# Patient Record
Sex: Male | Born: 1945 | Hispanic: Yes | Marital: Married
Health system: Southern US, Community
[De-identification: ages and names within clinical notes are randomized; demographics above are authoritative.]

---

## 2006-06-28 ENCOUNTER — Ambulatory Visit: Payer: Self-pay | Admitting: *Deleted

## 2006-07-18 ENCOUNTER — Ambulatory Visit: Payer: Self-pay

## 2006-08-31 ENCOUNTER — Ambulatory Visit: Payer: Self-pay

## 2007-12-22 ENCOUNTER — Ambulatory Visit: Payer: Self-pay | Admitting: Family Medicine

## 2008-12-29 ENCOUNTER — Ambulatory Visit: Payer: Self-pay | Admitting: Internal Medicine

## 2011-12-26 ENCOUNTER — Ambulatory Visit: Payer: Self-pay

## 2013-05-29 ENCOUNTER — Inpatient Hospital Stay: Payer: Self-pay | Admitting: Family Medicine

## 2013-05-29 LAB — CBC
HGB: 13 g/dL (ref 13.0–18.0)
MCH: 28.9 pg (ref 26.0–34.0)
MCHC: 34 g/dL (ref 32.0–36.0)
MCV: 85 fL (ref 80–100)
RBC: 4.49 10*6/uL (ref 4.40–5.90)
RDW: 14.5 % (ref 11.5–14.5)
WBC: 15.5 10*3/uL — ABNORMAL HIGH (ref 3.8–10.6)

## 2013-05-29 LAB — COMPREHENSIVE METABOLIC PANEL
Alkaline Phosphatase: 126 U/L (ref 50–136)
Anion Gap: 7 (ref 7–16)
BUN: 35 mg/dL — ABNORMAL HIGH (ref 7–18)
Chloride: 97 mmol/L — ABNORMAL LOW (ref 98–107)
Co2: 27 mmol/L (ref 21–32)
EGFR (Non-African Amer.): 39 — ABNORMAL LOW
Osmolality: 283 (ref 275–301)
Potassium: 4 mmol/L (ref 3.5–5.1)
Sodium: 131 mmol/L — ABNORMAL LOW (ref 136–145)
Total Protein: 7.9 g/dL (ref 6.4–8.2)

## 2013-05-29 LAB — MAGNESIUM: Magnesium: 1.5 mg/dL — ABNORMAL LOW

## 2013-05-29 LAB — CK TOTAL AND CKMB (NOT AT ARMC): CK-MB: 1.1 ng/mL (ref 0.5–3.6)

## 2013-05-29 LAB — TSH: Thyroid Stimulating Horm: 2.3 u[IU]/mL

## 2013-05-29 LAB — TROPONIN I: Troponin-I: <0.02 ng/mL

## 2013-05-30 DIAGNOSIS — I319 Disease of pericardium, unspecified: Secondary | ICD-10-CM

## 2013-05-30 LAB — BASIC METABOLIC PANEL
Anion Gap: 10 (ref 7–16)
BUN: 55 mg/dL — ABNORMAL HIGH (ref 7–18)
Calcium, Total: 8.5 mg/dL (ref 8.5–10.1)
Chloride: 98 mmol/L (ref 98–107)
Co2: 23 mmol/L (ref 21–32)
Creatinine: 3.03 mg/dL — ABNORMAL HIGH (ref 0.60–1.30)
EGFR (Non-African Amer.): 20 — ABNORMAL LOW
Glucose: 273 mg/dL — ABNORMAL HIGH (ref 65–99)
Osmolality: 287 (ref 275–301)
Potassium: 4.5 mmol/L (ref 3.5–5.1)
Sodium: 131 mmol/L — ABNORMAL LOW (ref 136–145)

## 2013-05-30 LAB — CBC WITH DIFFERENTIAL/PLATELET
Basophil #: 0.1 10*3/uL (ref 0.0–0.1)
Basophil #: 0.1 10*3/uL (ref 0.0–0.1)
Basophil %: 0.8 %
Eosinophil #: 0.1 10*3/uL (ref 0.0–0.7)
Eosinophil #: 0.1 10*3/uL (ref 0.0–0.7)
Eosinophil %: 0.4 %
Eosinophil %: 0.5 %
HCT: 36 % — ABNORMAL LOW (ref 40.0–52.0)
HGB: 12 g/dL — ABNORMAL LOW (ref 13.0–18.0)
Lymphocyte #: 2.3 10*3/uL (ref 1.0–3.6)
MCH: 29.2 pg (ref 26.0–34.0)
MCHC: 33.3 g/dL (ref 32.0–36.0)
MCHC: 34.6 g/dL (ref 32.0–36.0)
MCV: 84 fL (ref 80–100)
Monocyte #: 2.1 x10 3/mm — ABNORMAL HIGH (ref 0.2–1.0)
Monocyte #: 1.6 x10 3/mm — ABNORMAL HIGH (ref 0.2–1.0)
Monocyte %: 13.9 %
Monocyte %: 11.4 %
Neutrophil #: 10.6 10*3/uL — ABNORMAL HIGH (ref 1.4–6.5)
Neutrophil %: 70.1 %
Neutrophil %: 71.8 %
Platelet: 182 10*3/uL (ref 150–440)
Platelet: 196 10*3/uL (ref 150–440)
RBC: 4.28 10*6/uL — ABNORMAL LOW (ref 4.40–5.90)
RDW: 15 % — ABNORMAL HIGH (ref 11.5–14.5)
RDW: 14.9 % — ABNORMAL HIGH (ref 11.5–14.5)

## 2013-05-30 LAB — LIPID PANEL
Cholesterol: 176 mg/dL (ref 0–200)
HDL Cholesterol: 42 mg/dL (ref 40–60)
Ldl Cholesterol, Calc: 106 mg/dL — ABNORMAL HIGH (ref 0–100)
Triglycerides: 140 mg/dL (ref 0–200)
VLDL Cholesterol, Calc: 28 mg/dL (ref 5–40)

## 2013-05-30 LAB — CK TOTAL AND CKMB (NOT AT ARMC)
CK, Total: 31 U/L — ABNORMAL LOW (ref 35–232)
CK-MB: 1.1 ng/mL (ref 0.5–3.6)

## 2013-05-30 LAB — HEMOGLOBIN A1C: Hemoglobin A1C: 9.4 % — ABNORMAL HIGH (ref 4.2–6.3)

## 2013-05-30 LAB — TROPONIN I: Troponin-I: <0.02 ng/mL

## 2013-05-31 DIAGNOSIS — I428 Other cardiomyopathies: Secondary | ICD-10-CM

## 2013-05-31 DIAGNOSIS — N186 End stage renal disease: Secondary | ICD-10-CM

## 2013-05-31 DIAGNOSIS — D72829 Elevated white blood cell count, unspecified: Secondary | ICD-10-CM

## 2013-06-01 LAB — CBC WITH DIFFERENTIAL/PLATELET
Basophil #: 0.1 10*3/uL (ref 0.0–0.1)
Eosinophil %: 1.5 %
HCT: 35.2 % — ABNORMAL LOW (ref 40.0–52.0)
Lymphocyte #: 2.5 10*3/uL (ref 1.0–3.6)
Lymphocyte %: 24.6 %
MCHC: 34.1 g/dL (ref 32.0–36.0)
Neutrophil #: 6.1 10*3/uL (ref 1.4–6.5)
RDW: 14.7 % — ABNORMAL HIGH (ref 11.5–14.5)
WBC: 10.2 10*3/uL (ref 3.8–10.6)

## 2013-06-01 LAB — RENAL FUNCTION PANEL
Anion Gap: 12 (ref 7–16)
Calcium, Total: 8.3 mg/dL — ABNORMAL LOW (ref 8.5–10.1)
EGFR (African American): 14 — ABNORMAL LOW
Glucose: 265 mg/dL — ABNORMAL HIGH (ref 65–99)
Potassium: 3.8 mmol/L (ref 3.5–5.1)
Sodium: 130 mmol/L — ABNORMAL LOW (ref 136–145)

## 2013-06-03 LAB — CULTURE, BLOOD (SINGLE)

## 2013-08-29 ENCOUNTER — Inpatient Hospital Stay: Payer: Self-pay | Admitting: Internal Medicine

## 2013-08-29 LAB — URINALYSIS, COMPLETE
BILIRUBIN, UR: NEGATIVE
Glucose,UR: NEGATIVE mg/dL (ref 0–75)
KETONE: NEGATIVE
NITRITE: NEGATIVE
Ph: 6 (ref 4.5–8.0)
Protein: >=500
Specific Gravity: 1.012 (ref 1.003–1.030)
Squamous Epithelial: NONE SEEN

## 2013-08-29 LAB — CBC WITH DIFFERENTIAL/PLATELET
Basophil #: 0 10*3/uL (ref 0.0–0.1)
Basophil %: 0.6 %
EOS PCT: 1.7 %
Eosinophil #: 0.1 10*3/uL (ref 0.0–0.7)
HCT: 37.6 % — ABNORMAL LOW (ref 40.0–52.0)
HGB: 12.3 g/dL — ABNORMAL LOW (ref 13.0–18.0)
LYMPHS ABS: 1.2 10*3/uL (ref 1.0–3.6)
LYMPHS PCT: 17.3 %
MCH: 28.3 pg (ref 26.0–34.0)
MCHC: 32.6 g/dL (ref 32.0–36.0)
MCV: 87 fL (ref 80–100)
MONOS PCT: 21.4 %
Monocyte #: 1.5 x10 3/mm — ABNORMAL HIGH (ref 0.2–1.0)
Neutrophil #: 4.1 10*3/uL (ref 1.4–6.5)
Neutrophil %: 59 %
PLATELETS: 152 10*3/uL (ref 150–440)
RBC: 4.33 10*6/uL — AB (ref 4.40–5.90)
RDW: 15.9 % — ABNORMAL HIGH (ref 11.5–14.5)
WBC: 6.9 10*3/uL (ref 3.8–10.6)

## 2013-08-29 LAB — PROTIME-INR
INR: 1.1
Prothrombin Time: 14.3 secs (ref 11.5–14.7)

## 2013-08-29 LAB — COMPREHENSIVE METABOLIC PANEL
AST: 24 U/L (ref 15–37)
Albumin: 3.1 g/dL — ABNORMAL LOW (ref 3.4–5.0)
Alkaline Phosphatase: 128 U/L — ABNORMAL HIGH
Anion Gap: 6 — ABNORMAL LOW (ref 7–16)
BUN: 42 mg/dL — ABNORMAL HIGH (ref 7–18)
Bilirubin,Total: 0.5 mg/dL (ref 0.2–1.0)
CHLORIDE: 98 mmol/L (ref 98–107)
CREATININE: 2.44 mg/dL — AB (ref 0.60–1.30)
Calcium, Total: 8.3 mg/dL — ABNORMAL LOW (ref 8.5–10.1)
Co2: 27 mmol/L (ref 21–32)
EGFR (African American): 31 — ABNORMAL LOW
EGFR (Non-African Amer.): 26 — ABNORMAL LOW
Glucose: 326 mg/dL — ABNORMAL HIGH (ref 65–99)
Osmolality: 286 (ref 275–301)
POTASSIUM: 4.1 mmol/L (ref 3.5–5.1)
SGPT (ALT): 28 U/L (ref 12–78)
Sodium: 131 mmol/L — ABNORMAL LOW (ref 136–145)
Total Protein: 7.7 g/dL (ref 6.4–8.2)

## 2013-08-29 LAB — PHOSPHORUS: PHOSPHORUS: 2.4 mg/dL — AB (ref 2.5–4.9)

## 2013-08-29 LAB — MAGNESIUM: MAGNESIUM: 1.8 mg/dL

## 2013-08-29 LAB — TROPONIN I: Troponin-I: <0.02 ng/mL

## 2013-08-29 LAB — RAPID INFLUENZA A&B ANTIGENS

## 2013-08-30 ENCOUNTER — Ambulatory Visit: Payer: Self-pay | Admitting: Orthopedic Surgery

## 2013-08-30 LAB — CBC WITH DIFFERENTIAL/PLATELET
Basophil #: 0.1 10*3/uL (ref 0.0–0.1)
Basophil %: 0.8 %
EOS ABS: 0.2 10*3/uL (ref 0.0–0.7)
Eosinophil %: 2.8 %
HCT: 34.3 % — ABNORMAL LOW (ref 40.0–52.0)
HGB: 11.6 g/dL — AB (ref 13.0–18.0)
Lymphocyte #: 1.9 10*3/uL (ref 1.0–3.6)
Lymphocyte %: 28.6 %
MCH: 29.1 pg (ref 26.0–34.0)
MCHC: 33.9 g/dL (ref 32.0–36.0)
MCV: 86 fL (ref 80–100)
Monocyte #: 1.6 x10 3/mm — ABNORMAL HIGH (ref 0.2–1.0)
Monocyte %: 23.5 %
Neutrophil #: 3 10*3/uL (ref 1.4–6.5)
Neutrophil %: 44.3 %
PLATELETS: 138 10*3/uL — AB (ref 150–440)
RBC: 4 10*6/uL — ABNORMAL LOW (ref 4.40–5.90)
RDW: 16.2 % — ABNORMAL HIGH (ref 11.5–14.5)
WBC: 6.8 10*3/uL (ref 3.8–10.6)

## 2013-08-30 LAB — COMPREHENSIVE METABOLIC PANEL
ALBUMIN: 2.6 g/dL — AB (ref 3.4–5.0)
ALT: 23 U/L (ref 12–78)
Alkaline Phosphatase: 110 U/L
Anion Gap: 6 — ABNORMAL LOW (ref 7–16)
BUN: 55 mg/dL — ABNORMAL HIGH (ref 7–18)
Bilirubin,Total: 0.5 mg/dL (ref 0.2–1.0)
CHLORIDE: 104 mmol/L (ref 98–107)
Calcium, Total: 7.8 mg/dL — ABNORMAL LOW (ref 8.5–10.1)
Co2: 23 mmol/L (ref 21–32)
Creatinine: 2.79 mg/dL — ABNORMAL HIGH (ref 0.60–1.30)
GFR CALC AF AMER: 26 — AB
GFR CALC NON AF AMER: 22 — AB
Glucose: 231 mg/dL — ABNORMAL HIGH (ref 65–99)
Osmolality: 289 (ref 275–301)
POTASSIUM: 3.9 mmol/L (ref 3.5–5.1)
SGOT(AST): 28 U/L (ref 15–37)
SODIUM: 133 mmol/L — AB (ref 136–145)
Total Protein: 6.9 g/dL (ref 6.4–8.2)

## 2013-08-31 LAB — PHOSPHORUS: Phosphorus: 4.1 mg/dL (ref 2.5–4.9)

## 2013-09-01 LAB — URINE CULTURE

## 2013-09-03 LAB — CULTURE, BLOOD (SINGLE)

## 2013-10-27 ENCOUNTER — Ambulatory Visit: Payer: Self-pay | Admitting: Oncology

## 2013-10-30 ENCOUNTER — Ambulatory Visit: Payer: Self-pay | Admitting: Oncology

## 2013-11-04 ENCOUNTER — Ambulatory Visit: Payer: Self-pay | Admitting: Oncology

## 2013-12-04 ENCOUNTER — Ambulatory Visit: Payer: Self-pay | Admitting: Oncology

## 2013-12-16 ENCOUNTER — Ambulatory Visit: Payer: Self-pay | Admitting: Family Medicine

## 2013-12-18 ENCOUNTER — Ambulatory Visit: Payer: Self-pay | Admitting: Family Medicine

## 2014-03-06 DEATH — deceased

## 2014-09-22 IMAGING — CT NM PET TUM IMG SKULL BASE T - THIGH
1 of 8 series · 1 of 25 positions shown · non-contrast
Comparison: CT ANGIO CHEST dated 08/30/2013; CT CHEST W/ CM dated
05/29/2013; DG CHEST 2V dated 05/29/2013

CLINICAL DATA: Initial treatment strategy for pulmonary nodule.

EXAM:
NUCLEAR MEDICINE PET SKULL BASE TO THIGH
TECHNIQUE: 11.8 mCi F-18 FDG was injected intravenously. Full-ring PET imaging
was performed from the skull base to thigh after the radiotracer. CT
data was obtained and used for attenuation correction and anatomic
localization.
FASTING BLOOD GLUCOSE:  Value: 52 mg/dl

[Series 3: ct wb 5.0 b30f · axial · 5.0mm · 0.98mm/px · 1 of 290 slices shown]
[im 290/290  brain]
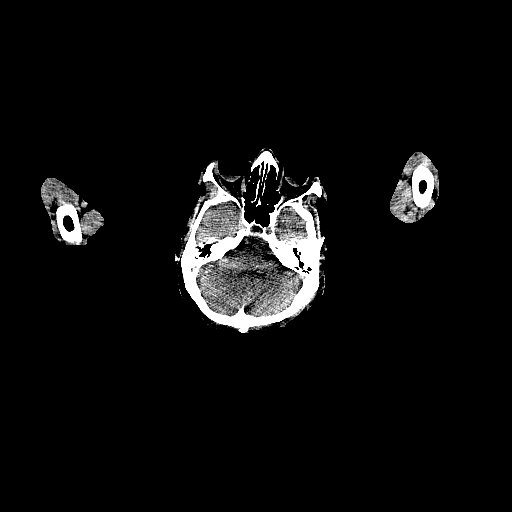

[1 of 25 positions shown; findings below may reference images not displayed]

FINDINGS: NECK

No hypermetabolic lymph nodes in the neck.

CHEST

There is small soft tissue mass at the AP window as described on
comparison CT. There is a tubular is extension of this superiorly
which indicates a possible vascular origin. There are central
calcifications within this soft tissue which suggests chronic
inflammation of this lesion. Lesion measures 18 x 30 mm which is not
significantly changed from 17 x 31 mm on CT of 05/29/2013. Lesion
does have mild metabolic activity with SUV max equal 5.2. This

There is a hypermetabolic paratracheal lymph node on the right
measuring 8 mm (image 66) with SUV max 3.9. No suspicious pulmonary
nodules. Atelectasis at the left lung base.

ABDOMEN/PELVIS

No abnormal hypermetabolic activity within the liver, pancreas,
adrenal glands, or spleen. No hypermetabolic lymph nodes in the
abdomen or pelvis. Right nephrectomy

SKELETON

No focal hypermetabolic activity to suggest skeletal metastasis.
IMPRESSION: 1. Hypermetabolic stress that mildly hypermetabolic soft tissue with
central calcification at the AP window with of mediastinum. Various
represent a chronic inflammatory process rather than malignancy.
Recommend follow-up CT with contrast an 3 months.

1. Mildly hypermetabolic soft tissue with central calcification at
the AP window of the mediastinum. Favor a chronic inflammatory
process over malignancy. Recommend follow-up CT with contrast in 3
months.
2. Small hypermetabolic lymph node in the high right paratracheal
location is likely inflammatory.
3. Right nephrectomy bed is stable without evidence of malignancy.

## 2014-11-08 IMAGING — CR DG FOOT COMPLETE 3+V*L*
1 series · 3 of 3 positions shown · non-contrast
Comparison: August 30, 2013

CLINICAL DATA: Cellulitis; recent calcaneal fracture

EXAM:
LEFT FOOT - COMPLETE 3+ VIEW

[Series 1: x foot ap left · 0.14mm/px · 3 of 3 slices shown]
[im 1/3]
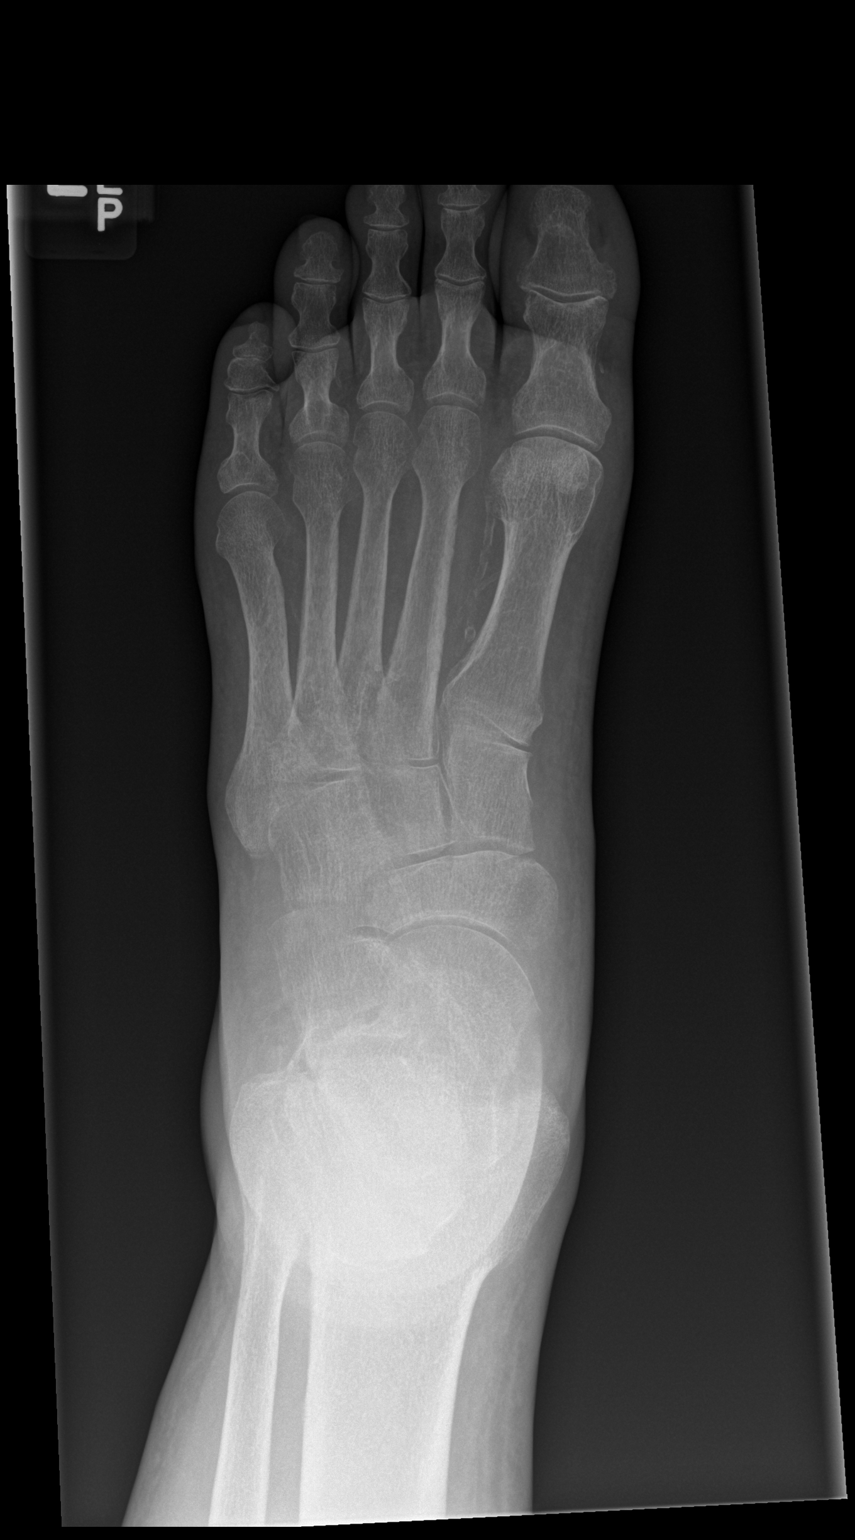
[im 2/3]
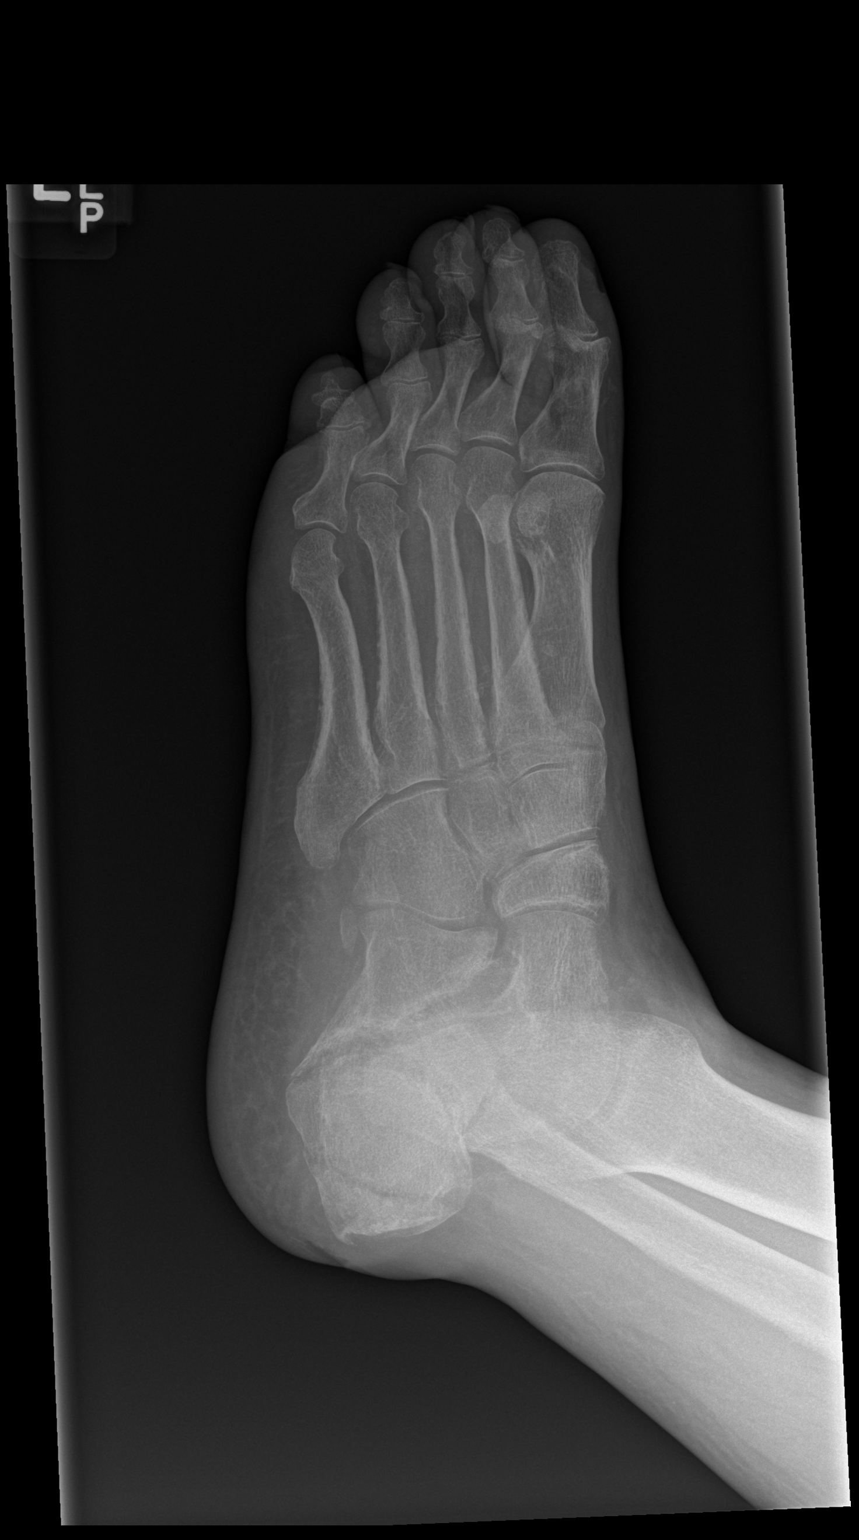
[im 3/3]
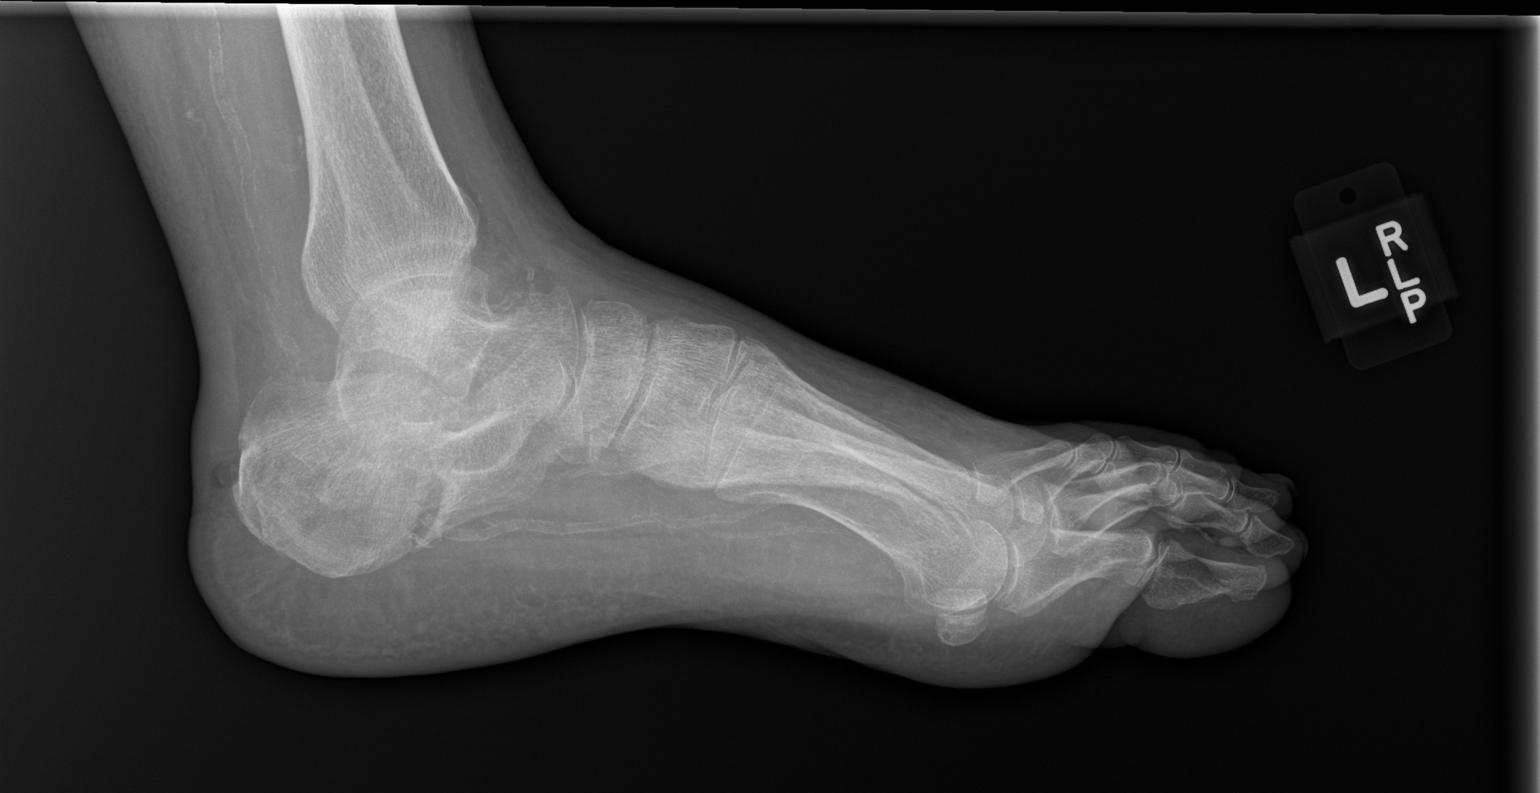

[3 of 3 positions shown; findings below may reference images not displayed]

FINDINGS: Frontal, oblique, and lateral views were obtained. Comminuted
fracture of the calcaneus is again noted. There is mild displacement
of several fracture fragments with only slight callus formation.

There is no new fracture.  No dislocation is appreciable.

There is no erosive change or bony destruction. No soft tissue
abscess is seen. There is extensive arterial vascular calcification.
Overall, Joint spaces appear intact.
IMPRESSION: Comminuted fracture of the calcaneus with very little change
compared to prior study. Several fracture fragments are mildly
displaced. No dislocation.

There is extensive arterial vascular calcification. Question
underlying diabetes mellitus.

No erosive change or bony destruction. No soft tissue abscess
appreciable.

No new fracture.

## 2014-11-26 NOTE — Consult Note (Signed)
General Aspect 69 year old male with end-stage renal disease on hemodialysis, diabetes who presents with chest pain.Cardiology was consulted for chest pain symptoms.  he reports having mild pain prior to HD. symptoms got worse during HD. He had a appreviated HD session and was sent to the ER.   In the ER, he describes it as going in a circle all around his chest and through to his back. He denies any nausea, vomiting, diaphoresis, dizziness, lightheadedness with it. He has had a cough for a few days, but denies any fever or chills. Pain seemed to get worse about 45 before HD was scheduled to finish.    In the ER, he continued to have pain that radiated to his back. Chest x-ray was performed, showing pulmonary edema. He was started on Levaquin empirically as well for possible pneumonia. CT scan of the chest also performed.   Physical Exam:  GEN well developed, well nourished, no acute distress, spanish speaking   HEENT hearing intact to voice, moist oral mucosa, dry oral mucosa   NECK supple   RESP normal resp effort   CARD Regular rate and rhythm  No murmur   ABD denies tenderness  soft   LYMPH negative neck   EXTR negative edema   SKIN normal to palpation   NEURO motor/sensory function intact   PSYCH alert, A+O to time, place, person, good insight   Review of Systems:  Subjective/Chief Complaint Chest pain, resolved   General: No Complaints   Skin: No Complaints   ENT: No Complaints   Eyes: No Complaints   Neck: No Complaints   Respiratory: No Complaints   Cardiovascular: Chest pain or discomfort   Genitourinary: No Complaints   Vascular: No Complaints   Musculoskeletal: No Complaints   Neurologic: No Complaints   Hematologic: No Complaints   Endocrine: No Complaints   Psychiatric: No Complaints   Review of Systems: All other systems were reviewed and found to be negative   Medications/Allergies Reviewed Medications/Allergies reviewed     Renal  Failure:    Anemia:    Diabetes:    Dialysis:        Admit Diagnosis:   CHEST PAIN: Onset Date: 31-May-2013, Status: Active, Description: CHEST PAIN  Home Medications: Medication Instructions Status  NovoLIN 70/30 human recombinant 70 units-30 units/mL subcutaneous suspension 30 unit(s) subcutaneous 2 times a day Active  amitriptyline 25 mg oral tablet 1 tab(s) orally once a day (at bedtime) Active   Lab Results: Routine Chem:  25-Oct-14 06:51   Hemoglobin A1c ([facility])  9.4 (The American Diabetes Association recommends that a primary goal of therapy should be <7% and that physicians should reevaluate the treatment regimen in patients with HbA1c values consistently >8%.)  Result Comment diff - diff verified/nyo/05-30-13/0823  Result(s) reported on 30 May 2013 at 07:25AM.  Glucose, Serum  273  BUN  55  Creatinine (comp)  3.03  Sodium, Serum  131  Potassium, Serum 4.5  Chloride, Serum 98  CO2, Serum 23  Calcium (Total), Serum 8.5  Anion Gap 10  Osmolality (calc) 287  eGFR (African American)  24  eGFR (Non-African American)  20 (eGFR values <46m/min/1.73 m2 may be an indication of chronic kidney disease (CKD). Calculated eGFR is useful in patients with stable renal function. The eGFR calculation will not be reliable in acutely ill patients when serum creatinine is changing rapidly. It is not useful in  patients on dialysis. The eGFR calculation may not be applicable to patients at the low and  high extremes of body sizes, pregnant women, and vegetarians.)  Cholesterol, Serum 176  Triglycerides, Serum 140  HDL (INHOUSE) 42  VLDL Cholesterol Calculated 28  LDL Cholesterol Calculated  106 (Result(s) reported on 30 May 2013 at 07:30AM.)  Cardiac:  24-Oct-14 14:48   Troponin I < 0.02 (0.00-0.05 0.05 ng/mL or less: NEGATIVE  Repeat testing in 3-6 hrs  if clinically indicated. >0.05 ng/mL: POTENTIAL  MYOCARDIAL INJURY. Repeat  testing in 3-6 hrs if  clinically  indicated. NOTE: An increase or decrease  of 30% or more on serial  testing suggests a  clinically important change)    22:05   CPK-MB, Serum 1.1 (Result(s) reported on 29 May 2013 at 10:31PM.)  Troponin I < 0.02 (0.00-0.05 0.05 ng/mL or less: NEGATIVE  Repeat testing in 3-6 hrs  if clinically indicated. >0.05 ng/mL: POTENTIAL  MYOCARDIAL INJURY. Repeat  testing in 3-6 hrs if  clinically indicated. NOTE: An increase or decrease  of 30% or more on serial  testing suggests a  clinically important change)  25-Oct-14 06:51   CK, Total  31  CPK-MB, Serum 1.1 (Result(s) reported on 30 May 2013 at 07:39AM.)  Troponin I < 0.02 (0.00-0.05 0.05 ng/mL or less: NEGATIVE  Repeat testing in 3-6 hrs  if clinically indicated. >0.05 ng/mL: POTENTIAL  MYOCARDIAL INJURY. Repeat  testing in 3-6 hrs if  clinically indicated. NOTE: An increase or decrease  of 30% or more on serial  testing suggests a  clinically important change)  Routine Hem:  24-Oct-14 14:48   WBC (CBC)  15.5  25-Oct-14 06:51   WBC (CBC)  15.2  RBC (CBC)  4.17  Hemoglobin (CBC)  12.0  Hematocrit (CBC)  36.0  Platelet Count (CBC) 182  MCV 86  MCH 28.7  MCHC 33.3  RDW  15.0  Neutrophil % 70.1  Lymphocyte % 14.9  Monocyte % 13.9  Eosinophil % 0.4  Basophil % 0.7  Neutrophil #  10.6  Lymphocyte # 2.3  Monocyte #  2.1  Eosinophil # 0.1  Basophil # 0.1    10:46   WBC (CBC)  14.4   EKG:  Interpretation EKG shows NSR IVCD, nonspecific ST ABN   Radiology Results: Cardiology:    25-Oct-14 09:05, Echo Doppler  Echo Doppler   REASON FOR EXAM:      COMMENTS:       PROCEDURE: [facility] - ECHO DOPPLER COMPLETE(TRANSTHOR)  - May 30 2013  9:05AM     RESULT: Echocardiogram Report    Patient Name:   Juan Austin Date of Exam: 05/30/2013  Medical Rec #:  [id number]               Custom1:  Date of Birth:  06/28/1948           Height:       68.0 in  Patient Age:    89 years             Weight:       176.0  lb  Patient Gender: M                    BSA:          1.94 m??    Indications: CHF  Sonographer:    Arville Go RDCS  Referring Phys: MODY, SITAL, P    Sonographer Comments: Suboptimal subcostal window. [facility]    Summary:   1. Left ventricular ejection fraction, by visual estimation, is 35 to   40%.  2. Moderately decreased global left ventricular systolic function.   3. Unable to exclude regional wall motion abnormality. There appears to   be mild to moderate global hypokinesis.   4. Impaired relaxation pattern of LV diastolic filling.   5. Normal right ventricular size and systolic function.   6. Mildly dilated left atrium.   7. Mild dilatation of the aortic root.   8. Small pericardial effusion   9. Normal RVSP  2D AND M-MODE MEASUREMENTS (normal ranges within parentheses):  Left Ventricle:          Normal  IVSd (2D):      1.13 cm (0.7-1.1)  LVPWd (2D):     1.15cm (0.7-1.1) Aorta/LA:                  Normal  LVIDd (2D):     6.49 cm (3.4-5.7) Aortic Root (2D): 3.30 cm (2.4-3.7)  LVIDs (2D):     5.28 cm           Left Atrium (2D): 4.60 cm (1.9-4.0)  LV FS (2D):     18.6 %   (>25%)  LV EF (2D):     37.7 %   (>50%)                                    Right Ventricle:                                    RVd (2D):  LV SYSTOLIC FUNCTION BY 2D PLANIMETRY (MOD):  EF-A4C View: 44.9 %  LV DIASTOLIC FUNCTION:  MV Peak E: 1.04 m/s Decel Time: 253 msec  MV Peak A: 1.45ms  E/A Ratio: 0.83  SPECTRAL DOPPLER ANALYSIS (where applicable):  Mitral Valve:  MV P1/2 Time: 73.37 msec  MV Area, PHT: 3.00 cm??  Aortic Valve: AoV Max Vel: 1.28 m/s AoV Peak PG: 6.6 mmHg AoV Mean PG:  LVOT Vmax: 0.83 m/s LVOT VTI:  LVOT Diameter: 2.60 cm  AoV Area, Vmax: 3.46 cm?? AoV Area, VTI:  AoV Area, Vmn:  Pulmonic Valve:  PV Max Velocity: 1.11 m/s PV Max PG: 4.9 mmHg PV Mean PG:    PHYSICIAN INTERPRETATION:  Left Ventricle: The left ventricular internal cavity size was normal. LV   posterior wall  thickness was normal. Mild left ventricular hypertrophy.     Global LV systolic function was moderately decreased. Left ventricular   ejection fraction, by visual estimation, is 35 to 40%. Spectral Doppler   shows impaired relaxation pattern of LV diastolic filling.  Right Ventricle: Normal right ventricular size, wall thickness, and   systolic function. The right ventricular size is normal. Global RV   systolic function is normal.  Left Atrium: The left atrium is mildly dilated.  Right Atrium: The right atrium is normal in size.  Pericardium: A small pericardial effusion is present.  Mitral Valve: The mitral valve is normal in structure. Mild mitral valve   regurgitation is seen.  Tricuspid Valve: The tricuspid valve is normal. Mild tricuspid   regurgitation is visualized.  Aortic Valve: The aortic valve was not well seen. Mild to moderate aortic   valve sclerosis/calcification is present, without any evidence of aortic     stenosis.  Pulmonic Valve: The pulmonic valve is normal. Trace pulmonic valve   regurgitation.  Aorta: There is mild dilatation of the aortic root.  [id number] Ida Rogue MD  Electronically signed by [id number] Ida Rogue MD  Signature Date/Time: 05/30/2013/11:17:43 AM    *** Final ***    IMPRESSION: .        Verified By: Minna Merritts, M.D., MD  CT:    24-Oct-14 19:00, CT Chest With Contrast  CT Chest With Contrast   REASON FOR EXAM:    dissection or pe t with significant pai raditaing to   back  COMMENTS:       PROCEDURE: CT  - CT CHEST WITH CONTRAST  - May 29 2013  7:00PM     RESULT: Chest CT dated 05/29/2013    Technique: Helical 3 mm sections were obtained from the thoracic inlet   the lung bases status post intravenous administration of 100 mL of   Isovue-370 utilizing cardiac gated technique.    Findings: The thoracic inlet is unremarkable. A small pericardial   effusion is identified as well as multichamber reactive enlargement.    There is no evidence of filling defects within the main, lobar, or     segmental pulmonary arteries. The lung parenchyma demonstrates prominence   of interstitial markings as well as areas of increased density within the   dependent portions of the right and left lower lobes. No focal recent   consolidation effusions are appreciated. The visualized upper abdominal   viscera grossly unremarkable.    There is no evidence of a thoracic aortic aneurysm nor dissection.    IMPRESSION:   1. No CT evidence of pulmonary arterial embolic disease.  2. Cardiomegaly and a very small pericardial effusion   3. interstitial findings as described above different considerations are   infectious versus inflammatory infiltrate versuspulmonary edema  4. Dependent atelectasis versus infiltrate in the lung bases.    Verified By: Mikki Santee, M.D., MD    No Known Allergies:   Vital Signs/Nurse's Notes: **Vital Signs.:   26-Oct-14 08:05  Vital Signs Type Q 4hr  Temperature Temperature (F) 97.6  Celsius 36.4  Pulse Pulse 84  Respirations Respirations 20  Systolic BP Systolic BP 213  Diastolic BP (mmHg) Diastolic BP (mmHg) 68  Mean BP 84  Pulse Ox % Pulse Ox % 95  Pulse Ox Activity Level  At rest  Oxygen Delivery 2L; post BRP    Impression 69 year old male with end-stage renal disease on hemodialysis, diabetes who presents with chest pain.Cardiology was consulted for chest pain symptoms.  1) Chest pain: He has nonischemic cardiomyopathy Suspect sx are noncardiac, possibly from underlying pulmonary infection, even from rapid dialysis on Friday cardiac enz neg x 3 He has had recent workup including stress test 2 months ago per family --No further workup at this time. he is pain free and ambulating.   2) Nonischemic cardiomyopathy per the son, he has had cardiac cath several year ago showing no significant CAD (we will obtain teh old records) --Family also reports previous echo showing  depressed EF --Echo here with EF 35%, global hypokinesis (challenging image quality). Family reports he had a nuclear stress test 2 months ago as part of a workup for possible renal transplant. By report, "looked ok". Recordds not available.  3) ESRD: on HD 2 x per week Managed by [facility] renal service.  4) Leukocytosis possible Pneumonia/bronchitis? seen on CT scan, cxray on levaquin   Electronic Signatures: Ida Rogue (MD)  (Signed 26-Oct-14 14:22)  Authored: General Aspect/Present Illness, History and Physical Exam, Review of System, Past Medical History, Health Issues, Home Medications, Labs, EKG ,  Radiology, Allergies, Vital Signs/Nurse's Notes, Impression/Plan   Last Updated: 26-Oct-14 14:22 by Ida Rogue (MD)

## 2014-11-26 NOTE — Discharge Summary (Signed)
PATIENT NAME:  Juan Austin, Juan Austin MR#:  [id number] DATE OF BIRTH:  06/28/1948  REASON FOR ADMISSION: Severe acute shortness of breath and chest pain.   DISCHARGE DIAGNOSES:  1.  Chest pain secondary to pulmonary edema and pneumonia.  2.  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  3.  End-stage renal disease.  4.  Hyponatremia.  5.  Leukocytosis, secondary to underlying infection.  6.  Community-acquired pneumonia.  7.  Insulin-dependent diabetes, uncontrolled.  8.  End-stage renal disease.  9.  History of urostomy.  10.  History of genitourinary tuberculosis.  11.  Neuropathy.  12.  Status post nephrectomy.   IMPORTANT LABORATORY RESULTS: Glucose 317 on admission, creatinine 1.76. At discharge it was 4.67. Sodium 131, GFR around 40%. Magnesium 1.5 on admission, calcium 8.3. Hemoglobin A1c 9.4. LFTs within normal  limits. Troponin 3 times negative. TSH 2.3. White count on admission 15,000, and at discharge 10.2. Hemoglobin 13 and on discharge 12.   EKG: Normal sinus rhythm.   Echocardiogram: Left ventricular ejection fraction of 35% to 40%, moderate decreased global left ventricular systolic function, moderate global hypokinesis. Positive left diastolic filling defects. Small pericardial effusion, normal right ventricular systolic pressures.   Blood culture is negative.   DISPOSITION: Home.   MEDICATIONS: Amitriptyline 25 mg at bedtime. Losartan 25 mg once a day, hold for systolic blood pressure of 160/60 or less on dialysis days. Novolin 70/30 of 35 units twice daily. This is an increase of the dose from previous doses. Metoprolol 12.5 mg every 12 hours. Hold on dialysis days if systolic blood pressure less than 100/60. Levaquin 500 mg  for 8 days.   FOLLOWUP: Dr. Mariah Milling p.r.n. if needed or [facility] cardiology. Followup [facility] primary care in 1 to 2 weeks. Followup [facility] nephrology with Dr. Austin Miles in the next  week.   HOSPITAL COURSE: A very nice 69 year old gentleman with a history of end-stage renal disease who was admitted after about having chest pain on hemodialysis. Apparently the patient was having chest pain prior to dialysis, just some mild discomfort, but he went to dialysis. He had extra fluid to be removed, and after dialysis was started, he developed significant chest pain.   When I discussed the case with Dr. Wynelle Link in nephrology, he told that he believes that his dialysis was done too quick and that he may have some ischemia  out of that. The pain resolved after he was dialyzed here in house the day of admission since he had significant pulmonary edema.   In the ER, his chest x-ray had significant fluid overload and pulmonary edema. The patient had dialysis STAT and patient started breathing much better.   His chest pain was evaluated. He had significant improvement after dialysis. Cardiac enzymes were negative x3. The patient had a stress test done recently within the last several months at [facility] and apparently it was negative, and that was for evaluation for kidney transplant.   The patient had an echocardiogram that showed an ejection fraction of 35%. Previous echocardiograms in the past, 2010, from records from [facility] showed that his ejection fraction was reduced up to 25%. His ejection fraction improved within 6 months on repeat echocardiogram up to 40%.   Right now, his EF is stable. The patient has been reduced from hemodialysis from 3 days to 2 days, and overall he could get back to 3 days if this problem continues to happen.  The patient was dialyzed in  house 2 days in a row, Friday and Saturday, and had significant relief. He is on oxygen chronically at 2 liters nasal cannula. He has been on it for several days.   A CT scan of the chest was done for evaluation of pulmonary embolus. It did not show pulmonary embolus but it showed significant pulmonary edema and a left lower lobe  infiltrate that was consistent with pneumonia.   He had an elevation of white blood count of 15,000 and he was started on antibiotics, Levaquin. The patient had significant improvement of his white count which has normalized now.   He has hyponatremia which is chronic and it was stable.   He has diabetes that was not well  controlled with a hemoglobin A1c above 9. His 70/30 was decreased to 35 units. He is to follow with his primary care physician for adjustment of this medication.   I do not feel comfortable telling him to adjust his insulin by himself as he does not seem to have good insight of the situation.   The patient had an evaluation by cardiology due to his decreased ejection fraction, but they will determine that this has been stable. No need for further stress testing or cardiac catheterization.   The patient has balanitis which is likely secondary to Candida. Will discharge him on miconazole for 10 more days.   Since his ejection fraction was decreased, he had increase of fluid retention and pulmonary edema. He was started on losartan and metoprolol with recommendations to hold it if his blood pressure is low.   The patient [facility] or [facility] as needed.  These 2 medications can be stopped if the patient starts having low blood pressures.   Follow up within the next week.   TIME SPENT ON DISCHARGE: About 60 minutes.    ____________________________ Felipa Furnaceoberto Sanchez Gutierrez, MD rsg:np D: 06/01/2013 14:10:24 ET T: 06/01/2013 22:11:03 ET JOB#: [id number]  cc: Felipa Furnaceoberto Sanchez Gutierrez, MD, <Dictator> Molli Barrowsaven Voora, MD Pearletha FurlOBERTO SANCHEZ GUTIERRE MD ELECTRONICALLY SIGNED 06/19/2013 23:40

## 2014-11-26 NOTE — H&P (Signed)
PATIENT NAME:  Juan Austin, Juan Austin MR#:  [id number] DATE OF BIRTH:  06/28/1948  DATE OF ADMISSION:  05/29/2013  [facility].   CHIEF COMPLAINT: Chest pain.   HISTORY OF PRESENT ILLNESS: The patient is a 69 year old male with end-stage renal disease on hemodialysis and diabetes who presents with chest pain. The patient states that since yesterday, he has had constant chest pain. It goes in a circle all around his chest and then it goes to his back. He denies any nausea, vomiting, diaphoresis, dizziness, lightheadedness with it. He also has had a cough for a few days, but denies any fever or chills. He went to dialysis today. He was almost done with dialysis. He had about 45 more minutes left when he was complaining of this pain, so they brought him to the ER for further evaluation. In the ER, he continued to have this kind of constant pain that goes to his back. Chest x-ray was performed, shows pulmonary edema, does not show widened mediastinum. He was started on Levaquin empirically as well for possible pneumonia.   REVIEW OF SYSTEMS:   CONSTITUTIONAL: No fever. No fever, fatigue, weakness, weight loss, weight gain.  EYES: No blurred or double vision, glaucoma or cataracts. EARS, NOSE, THROAT: No ear pain, hearing loss, seasonal allergies. RESPIRATORY: Positive cough. No wheezing, hemoptysis, COPD, dyspnea.  CARDIOVASCULAR: Positive chest pain. No orthopnea, palpitations, edema, syncope, arrhythmia, dyspnea on exertion.  GASTROINTESTINAL: No nausea, vomiting, diarrhea, abdominal pain, melena or ulcers.  GENITOURINARY: He still makes urine, but no dysuria or hematuria. ENDOCRINE: No polyuria or polydipsia.  HEMATOLOGIC AND LYMPHATIC: No anemia or easy bruising.  SKIN: No rash or lesions.  MUSCULOSKELETAL: No limited activity, arthritis or cramps.  NEUROLOGIC: No CVA, TIA or seizures.  PSYCHIATRIC: No history of anxiety or depression.  PAST MEDICAL HISTORY: 1.   End-stage renal disease, on hemodialysis.  2.  Diabetes with neuropathy.   MEDICATIONS: 1.  NPH 70/30, 30 units b.i.d.  2.  Amitriptyline 25 mg p.o. at bedtime.   ALLERGIES: HE SAYS HE IS ALLERGIC TO SOME SORT OF PAIN MEDICATION THAT HE GOT AFTER SURGERY, BUT THEY DO NOT KNOW WHICH ONE IT IS.   FAMILY HISTORY: Positive for CAD.   PAST SURGICAL HISTORY: 1.  Urostomy.  2.  Prostate removal.  3.  One kidney removal.   SOCIAL HISTORY: No tobacco, alcohol or drug use.   PHYSICAL EXAMINATION: VITAL SIGNS: Temperature 98.6, pulse 80, respirations 18, blood pressure 191/98, 95% on room air.  GENERAL: The patient is alert. He seems to be in moderate distress.  HEENT: Head is atraumatic. Pupils are round, reactive. Sclerae anicteric. Mucous membranes are moist. Oropharynx is clear.  NECK: Supple without JVD, carotid bruit or enlarged thyroid.  CARDIOVASCULAR: Regular rate and rhythm. No murmurs, gallops or rubs. PMI is laterally displaced.  LUNGS: He has  bilateral crackles at the bases with no dullness to percussion. Normal chest expansion. No rhonchi or wheezing.  ABDOMEN: Bowel sounds are positive. Nontender, nondistended. No hepatosplenomegaly. BACK: No CVA or vertebral tenderness.  EXTREMITIES: 1+ pitting edema bilaterally.  NEUROLOGIC: Cranial nerves II through XII are intact. There are no focal deficits.  SKIN: Without rash or lesions He has some tawny-colored discoloration of his lower extremities.   LABORATORY, DIAGNOSTIC AND RADIOLOGICAL DATA: Sodium 131, potassium 4.0, chloride 97, bicarbonate 27, BUN 35, creatinine 1.76, glucose 317, bilirubin 0.7, alkaline phosphatase 126, ALT 24, AST 29, total protein 7.9, albumin 3.4. White blood cells 15.5, hemoglobin  13, hematocrit 38.2; platelets are 221. Magnesium 1.5. Troponin less than 0.02. Calcium 8.8. EKG: Normal sinus rhythm. No ST elevations or depressions. Chest x-ray shows bilateral pulmonary edema, enlarged heart, but no widened  mediastinum.   ASSESSMENT AND PLAN: This is a 69 year old male with end-stage renal disease on hemodialysis and diabetes who presents with chest pain.  1.  Chest pain: He says his chest pain is actually radiating to his back. It could be due to pneumonia or pulmonary edema. However, I want to make sure that there is nothing else going on such as a dissection. We cannot obtain blood pressures from both arms secondary to his dialysis, so we will obtain a CT scan stat, and I have also spoken with nephrology to do dialysis right after the CT scan. We cannot look for pulmonary emboli. The patient is not hypoxic or tachycardic and is ambulatory at baseline, so this is less likely but in any case, we will order a CT to rule out a dissection. We will place him on telemetry if there is no dissection, check cardiac enzymes, echocardiogram, and do lower  extremity Dopplers to rule out a deep vein thrombosis. The patient does also have an elevated white blood cell count. He could possibly have a pneumonia as well, so I will continue Levaquin and blood cultures been ordered. 2.  Pulmonary edema, as seen on chest x-ray and on physical exam: We will order an echocardiogram, as the patient does have an enlarged heart on the chest x-ray, to rule out pulmonary edema from a cardiac cause versus end-stage renal disease. The patient does still make urine. However, he is going for dialysis so will not use Lasix at this time.  3.  Hyponatremia: It corrected to 133 with the elevated blood sugars. will check a TSH and repeat in the a.m.  4.  Leukocytosis: Possibly from an underlying infection. Will go ahead and treat with Levaquin for pneumonia. The patient does say that he has had a cough.  5.  Diabetes: Will resume his outpatient medications, sliding scale insulin.  6.  End-stage renal disease on hemodialysis: As mentioned, the patient will receive dialysis after his CT scan. I have spoken with Dr. Wynelle Link from nephrology.  7.   Neuropathy: We will continue amitriptyline.  8.  The patient is a full code status.   TIME SPENT: Approximately 60 minutes.  ____________________________ Janyth Contes. Juliene Pina, MD spm:jm D: 05/29/2013 18:12:30 ET T: 05/29/2013 18:47:27 ET JOB#: [id number]  cc: Huda Petrey P. Juliene Pina, MD, <Dictator> [facility] Jerrard Bradburn P Lunabella Badgett MD ELECTRONICALLY SIGNED 05/30/2013 22:08

## 2014-11-27 NOTE — H&P (Signed)
PATIENT NAME:  Juan NeighborMEZA Austin, Juan Austin MR#:  [id number] DATE OF BIRTH:  06/28/1948  DATE OF ADMISSION:  08/29/2013  REFERRING PHYSICIAN: Dr. Daryel NovemberJonathan Williams   FAMILY PHYSICIAN: Dr. Dewayne HatchAnn Chelminski  REASON FOR ADMISSION: Fever, with altered mental status and hypoxia.   HISTORY OF PRESENT ILLNESS: The patient is a 69 year old male with a history of chronic renal failure, on hemodialysis, as well as diabetes. Had dialysis today. Presents to the Emergency Room, where he was noted to be short of breath, with confusion. He was also febrile. Had fallen several days ago, injuring his left lower extremity. Was apparently oriented during dialysis earlier today, but is now confused. In the Emergency Room, the patient was noted to be febrile and hypoxic. He is now admitted for further evaluation.   PAST MEDICAL HISTORY: 1.  End-stage renal disease, on hemodialysis.  2.  Chronic anemia.  3.  Type 2 diabetes, on insulin. 4.  Benign hypertension.  5.  Recent left lower extremity trauma.   MEDICATIONS: 1.  Novolin 70/30, 35 units subcu b.i.d.  2.  Lopressor 12.5 mg p.o. b.i.d.  3.  Losartan 25 mg p.o. daily.  4.  Amitriptyline 25 mg p.o. at bedtime.   ALLERGIES: No known drug allergies.   SOCIAL HISTORY: Negative for alcohol or tobacco abuse.   FAMILY HISTORY: Positive for diabetes, but otherwise unremarkable.   REVIEW OF SYSTEMS:    CONSTITUTIONAL: Some fever. No change in weight.  EYES: No blurred or double vision. No glaucoma.  ENT: No tinnitus or hearing loss. No nasal discharge or bleeding. No difficulty swallowing.  RESPIRATORY: No cough or wheezing. No hemoptysis.  CARDIOVASCULAR: No chest pain or palpitations. No syncope.  GASTROINTESTINAL:  Some nausea, but no vomiting or diarrhea. No change in bowel habits.  GENITOURINARY: No dysuria, hematuria, or incontinence.  ENDOCRINE: No polyuria, polydipsia, or heat or cold intolerance.  HEMATOLOGIC: The patient admits to anemia, but denies easy  bruising or bleeding.  LYMPHATIC: No swollen glands.  MUSCULOSKELETAL: The patient has pain in his leg, but denies pain in his neck, back, shoulders, or hips. No gout.  NEUROLOGIC: No numbness or migraines. Denies stroke or seizures.  PSYCHIATRIC: The patient denies anxiety, insomnia, or depression.   PHYSICAL EXAMINATION: GENERAL: The patient is in no acute distress.  VITAL SIGNS: Currently remarkable for a blood pressure 114/83, with a heart rate of 100, respiratory rate of 20, temperature 102.0.  HEENT: Normocephalic, atraumatic. Pupils equally round and reactive to light and accommodation. Extraocular movements are intact. Sclerae are not icteric. Conjunctivae are clear. Oropharynx is clear.  NECK: Supple, without JVD. No adenopathy or thyromegaly is noted.  LUNGS:  Revealed scattered rhonchi, without wheezes or rales. No dullness. Respiratory effort is normal.  CARDIAC: Regular rate and rhythm, with a normal S1, S2. No significant rubs, murmurs, or gallops. PMI is nondisplaced. Chest wall is nontender.  ABDOMEN: Soft, nontender, with normoactive bowel sounds. No organomegaly or masses were appreciated. No hernias or bruits were noted.  EXTREMITIES: Trace edema with stasis changes. Pulses were 2+ bilaterally.  SKIN: Warm and dry, without rash or lesions.  NEUROLOGIC: Exam revealed cranial nerves II through XII grossly intact. Deep tendon reflexes were symmetric. Motor and sensory exams are grossly nonfocal. Generalized weakness was present.  PSYCHIATRIC: Exam revealed a patient who was alert and oriented to person, place, and time. He was cooperative and used good judgment.   LABORATORY DATA: Chest x-ray revealed interstitial prominence, with no obvious infiltrate. Cardiomegaly was noted. Flu swabs  were negative. Urinalysis revealed 3+ bacteria, with trace leukocyte esterase and definite proteinuria. White count was 6.9, with a hemoglobin of 12.3. Troponin was less than 0.02. Magnesium 1.8.  Glucose 326, with a BUN of 42 and a creatinine of 2.44, with a GFR of 26. Sodium was 131.   ASSESSMENT: 1.  Systemic inflammatory response syndrome, manifested by fever and tachycardia.  2.  Presumed cystitis.  3.  Hypoxia of unclear etiology.  4.  End-stage renal disease, on hemodialysis.  5.  Anemia of chronic disease.  6.  Recent left lower extremity trauma.   PLAN: The patient will be admitted to the floor with IV fluids and IV antibiotics. Will follow his sugars closely. Blood and urine cultures have been sent off. Will wean oxygen as tolerated and begin empiric SVNs at this time. Will obtain ultrasound of bilateral lower extremities to rule out DVT, as well as a CT of the chest to rule out pulmonary embolism. Will consult Nephrology for continuing hemodialysis. Will also consult Physical Therapy and Orthopedics. Routine labs in the morning. Further treatment and evaluation will depend upon the patient's progress.   Total time spent on this patient was 50 minutes.    ____________________________ Duane Lope Judithann Sheen, MD jds:mr D: 08/29/2013 20:36:00 ET T: 08/29/2013 21:00:44 ET JOB#: [id number]  cc: Duane Lope. Judithann Sheen, MD, <Dictator> Anab Vivar Rodena Medin MD ELECTRONICALLY SIGNED 08/30/2013 14:40

## 2014-11-27 NOTE — Discharge Summary (Signed)
PATIENT NAME:  Juan Austin, Dmoni MR#:  [id number] DATE OF BIRTH:  06/28/1948  DATE OF ADMISSION:  08/29/2013 DATE OF DISCHARGE:  09/01/2013  PRESENTING COMPLAINT: Fever and shortness of breath.   DISCHARGE DIAGNOSES: 1.  Systemic inflammatory response syndrome due to Escherichia coli urinary tract infection.  2.  Congestive heart failure, acute on chronic systolic.  3.  End-stage renal disease, on hemodialysis. 4.  Left calcaneal fracture, status post cast.   CONDITION ON DISCHARGE: Fair. Sats are 93% to 97% on room air.   CODE STATUS: Full code.   MEDICATIONS: 1.  Amitriptyline 25 mg at bedtime. 2.  Losartan 25 mg p.o. daily.  3.  Novolin 70/30, 35 units b.i.d.  4.  Metoprolol 12.5 mg b.i.d.  5.  Oxycodone 5 mg 1 tablet every 4 hours as needed.  6.  Keflex 250 p.o. b.i.d.   DIET: Low-sodium, ADA 1800 calorie renal diet.   FOLLOWUP:  1.  With Dr. Dietrich Pateshelminski in 1 to 2 weeks.  2.  Resume hemodialysis as before.   CONSULTATIONS: Nephrology consultation with Dr. Thedore MinsSingh.   LABORATORY AND RADIOLOGICAL DATA: Ultrasound Doppler normal. CT angiography showed no evidence of PE. Soft tissue mass from the AP window and left hilum as described; malignancy cannot be excluded; PET scan is recommended. Intermediate patchy density in the left base. Overall airspace disease in both lungs has improved since prior study. This appears to be a calcified mass. Tiny left pleural effusion. No acute bony abnormality.   White count is 6.8; hemoglobin and hematocrit are 11.6 and 34.3; platelet count is 138. Urinalysis positive for UTI.   HOSPITAL COURSE: Juan Austin is a 69 year old gentleman with history of end-stage renal disease on hemodialysis, who comes in with fever and shortness of breath. He was admitted with:  1.  SIRS that is manifested by fever and tachycardia, likely due to UTI. His culture grew E. coli. He was changed from IV Rocephin to p.o. Keflex. Blood cultures remained negative.  The patient remained afebrile. White count was stable.  2.  Hypoxia with acute shortness of breath and respiratory failure due to CHF exacerbation/pulmonary edema. The patient did have hemodialysis and ultrafiltration of 1100 mL. He uses oxygen at bedtime, which he is asked to start back on. The patient's sats remained 93% to 97% on room air.  3.  End-stage renal disease, on hemodialysis. Dr. Thedore MinsSingh saw the patient while in-house.  4.  Anemia of chronic disease, which remained stable.  5.  Recent lower extremity trauma/heel fracture. [facility]. Follow up with ortho as outpatient.  6.  Incidental note of soft tissue mass as noted on CT of the chest. Discussed with family to follow up with primary care physician to ensure repeat imaging study down the road in next few months to make sure it is not increasing in size. It appears to a partially calcified mass with soft tissue density in the left hilum.  7.  Overall hospital stay remained stable. The patient's discharge plan was discussed with his brother, who was present in the room, and also had updated the patient's daughter on the phone.   TIME SPENT: 40 minutes.    ____________________________ Wylie HailSona A. Allena KatzPatel, MD sap:jcm D: 09/03/2013 15:02:26 ET T: 09/03/2013 16:10:05 ET JOB#: [id number]  cc: Conda Wannamaker A. Allena KatzPatel, MD, <Dictator> Willow OraSONA A Karen Kinnard MD ELECTRONICALLY SIGNED 09/17/2013 13:15

## 2014-11-27 NOTE — Consult Note (Signed)
PATIENT NAME:  Juan Austin, NACE MR#:  [id number] DATE OF BIRTH:  06/28/1948  DATE OF CONSULTATION:  08/30/2013  REFERRING PHYSICIAN:  Duane Lope. Judithann Sheen, MD CONSULTING PHYSICIAN:  Ruthe Mannan, MD  CHIEF COMPLAINT:  Left foot pain.    HISTORY OF PRESENT ILLNESS:  The patient is a 69 year old male, Spanish-speaking only, who fell down the stairs on Tuesday of last week.  He presented to [facility] where he was diagnosed with a foot injury of uncertain nature and was placed in a splint.  The family is unable to give many details regarding this.  The patient is currently admitted to the hospital for medical reasons including fever with altered mental status and hypoxia.    Of note, the patient is a diabetic on dialysis.  He has been on dialysis for 2 years.  He does hemodialysis.    I had a discussion with the family, the daughter and the wife and the patient in [place].  They relayed to me that he fell down the stairs on Tuesday of last week.  He has had no other injuries.  They relayed the remainder of the history but they are uncertain on many of the medical and orthopedic details.  The patient notes he is having pain in the left lower extremity.  He says it is in the heel and in the midfoot.  No pain radiating proximally into the tibia.    PAST MEDICAL HISTORY:   1.  End-stage renal disease on hemodialysis. 2.  Chronic anemia. 3.  Type 2 diabetes, insulin-dependent. 4.  Hypertension.    MEDICATIONS:  Please see electronic medical record.  ALLERGIES:  None.  SOCIAL HISTORY:  The patient lives with his wife.  He does not drink or smoke.    FAMILY HISTORY:  Positive for diabetes, otherwise unremarkable.  REVIEW OF SYSTEMS:  Negative other than as discussed in the  HPI.    PHYSICAL EXAMINATION: GENERAL:  The patient is a pleasant Hispanic male in no apparent distress.   VITAL SIGNS:  Stable. HEAD:  Atraumatic. CHEST:  Equal and symmetric. ABDOMEN:   Soft. EXTREMITIES:  Left lower extremity is in a fluffy Quincy Simmonds dressing.  This was removed at the bedside by me.  Left lower extremity has minimal edema.  There is ecchymosis in the heel region typical of a dependent position.  There is ecchymosis around the heel.  He is tender to palpation in the hindfoot.  No midfoot or forefoot tenderness.  Nontender over the tibia.  Mild tenderness to palpation around the ankle.  Nontender over the medial and lateral malleolus.    IMAGING:  X-rays demonstrate a minimally displaced calcaneus fracture.    ASSESSMENT:  A 69 year old male with end-stage renal disease, insulin-dependent diabetic with a minimally displaced left calcaneus fracture.    DISCUSSION:  I had a long discussion with the patient and the family in [place].  I explained the nature of calcaneus fractures.  In addition, I explained the nature of foot injuries in diabetics.  I explained that these are nearly always treated nonoperatively secondary to the very high risk of surgical treatment in the feet of diabetics.  I explained that with the minimal displacement of his calcaneus fracture, this is likely to heal in situ with a mild to moderate amount of deformity.  I explained the likelihood of some persistent pain in the foot but a functional extremity that allows him to ambulate.  I explained that often treatment, while theoretically  possible, would subject him to grave risk including a risk of loss of limb.  Although the complication rate of calcaneus fractures in diabetics is not well studied, I did quote the ankle fracture in diabetics literature which estimated the complication rates to be 30% or greater.    For treatment of his calcaneus fracture, I placed him back in a splint, a posterior slab splint.   The patient should follow up with my partner, Dr. Martha ClanKrasinski, in 2 to 3 weeks.  The family verbalized understanding of the plan.    ____________________________ Ruthe MannanPeter Julion Gatt,  MD pa:cs D: 08/30/2013 11:05:03 ET T: 08/30/2013 14:51:08 ET JOB#: [id number]  cc: Ruthe MannanPeter Toshiko Kemler, MD, <Dictator> Althea GrimmerPeter J. Mickle PlumbApel, MD PhD Orthopaedic Surgery ELECTRONICALLY SIGNED 08/31/2013 14:12
